# Patient Record
Sex: Female | Born: 1975 | Race: Black or African American | Hispanic: No | Smoking: Never smoker
Health system: Southern US, Community
[De-identification: ages and names within clinical notes are randomized; demographics above are authoritative.]

## PROBLEM LIST (undated history)

## (undated) DIAGNOSIS — I10 Essential (primary) hypertension: Secondary | ICD-10-CM

## (undated) HISTORY — PX: DILATION AND CURETTAGE OF UTERUS: SHX78

---

## 2019-11-30 ENCOUNTER — Encounter (HOSPITAL_COMMUNITY): Payer: Self-pay | Admitting: *Deleted

## 2019-11-30 ENCOUNTER — Other Ambulatory Visit: Payer: Self-pay

## 2019-11-30 ENCOUNTER — Emergency Department (HOSPITAL_COMMUNITY): Payer: Medicaid - Out of State

## 2019-11-30 ENCOUNTER — Emergency Department (HOSPITAL_COMMUNITY)
Admission: EM | Admit: 2019-11-30 | Discharge: 2019-11-30 | Disposition: A | Discharge status: Home / Self Care | Payer: Medicaid - Out of State | Attending: Emergency Medicine | Admitting: Emergency Medicine

## 2019-11-30 DIAGNOSIS — M549 Dorsalgia, unspecified: Secondary | ICD-10-CM | POA: Diagnosis not present

## 2019-11-30 DIAGNOSIS — M79621 Pain in right upper arm: Secondary | ICD-10-CM | POA: Diagnosis not present

## 2019-11-30 DIAGNOSIS — R079 Chest pain, unspecified: Secondary | ICD-10-CM | POA: Diagnosis present

## 2019-11-30 DIAGNOSIS — Z5321 Procedure and treatment not carried out due to patient leaving prior to being seen by health care provider: Secondary | ICD-10-CM | POA: Insufficient documentation

## 2019-11-30 HISTORY — DX: Essential (primary) hypertension: I10

## 2019-11-30 MED ORDER — SODIUM CHLORIDE 0.9% FLUSH: 3.0000 mL | Freq: Once | INTRAVENOUS | Status: DC

## 2019-11-30 NOTE — ED Triage Notes (Signed)
Pt states she was in grocery store about an hour ago, tripped and fell. Now she has chest pain, upper back, upper rt arm pain. No LOC nor head injury

## 2021-09-17 IMAGING — CR DG HUMERUS 2V *R*
2 series · 2 of 2 positions shown · non-contrast
Comparison: None.

CLINICAL DATA: Pain after fall

EXAM:
RIGHT HUMERUS - 2+ VIEW

[w humerus ap right]
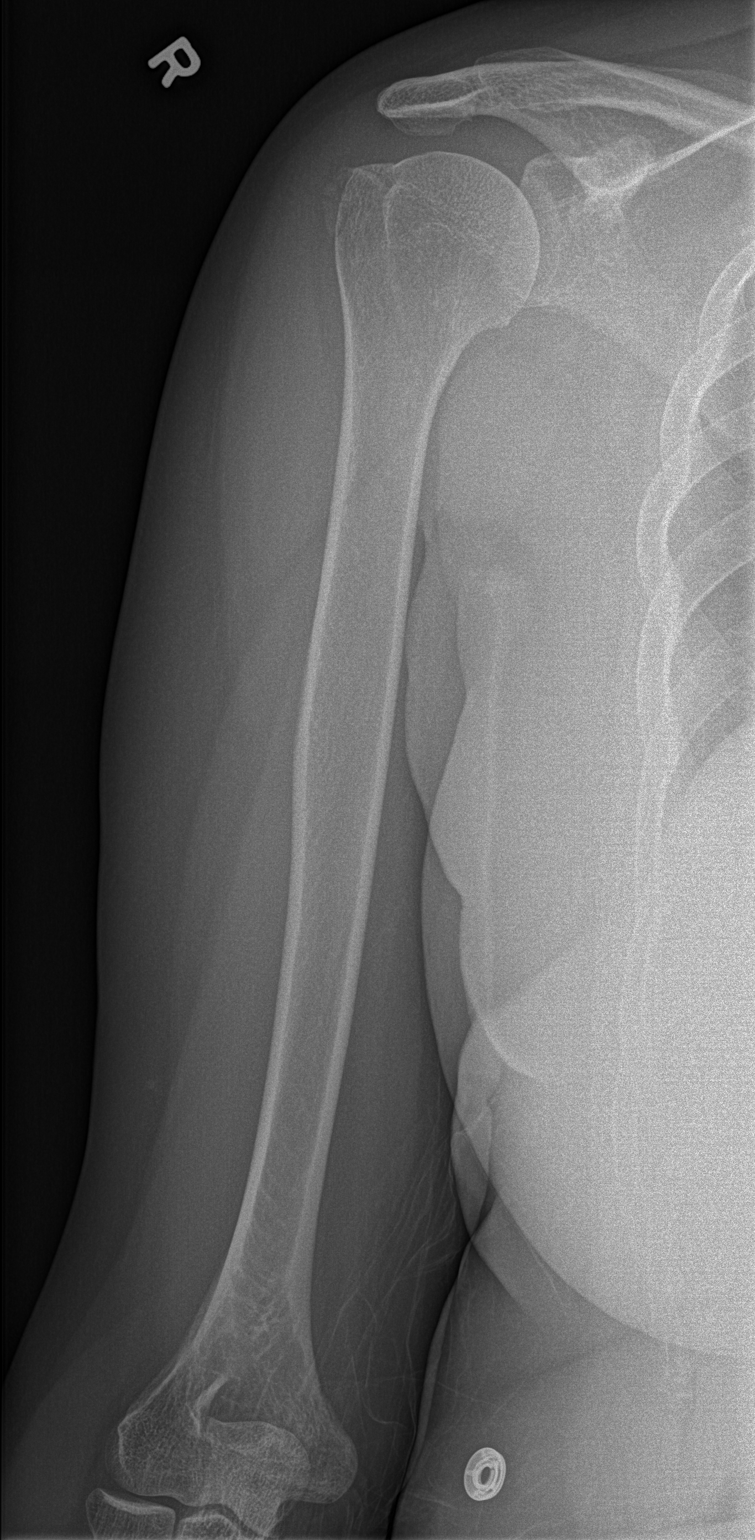

[w humerus lat right]
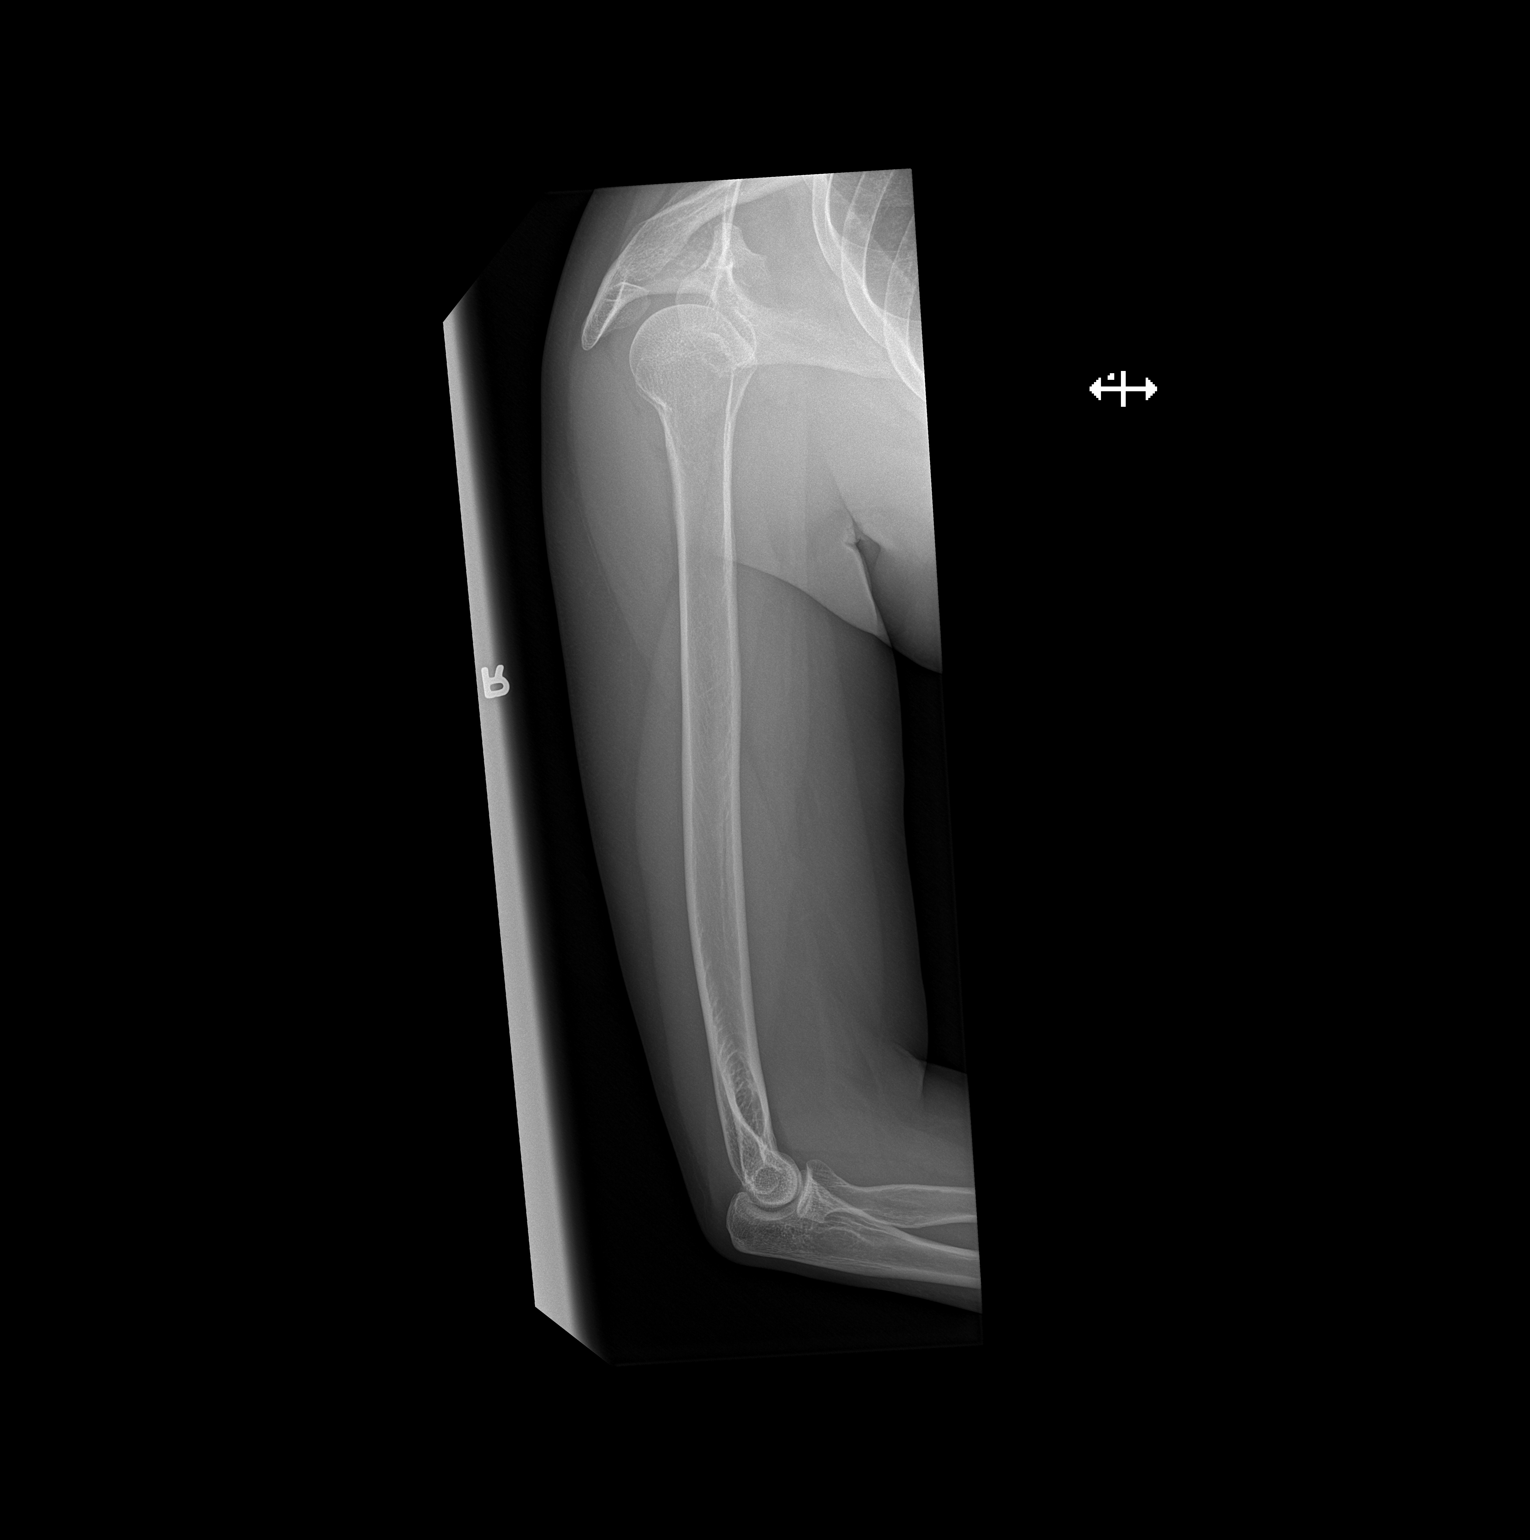

[2 of 2 positions shown; findings below may reference images not displayed]

FINDINGS: Mild increased attenuation in the soft tissues lateral to the
humeral head on the externally rotated view only. The study is
otherwise normal.
IMPRESSION: Mild increased attenuation in the soft tissues lateral to the
humeral head is nonspecific but could represent calcific tendinosis.
Recommend dedicated images of the shoulder if there is pain in this
location.
